# Patient Record
Sex: Male | Born: 2005 | Race: White | Hispanic: No | Marital: Single | Smoking: Never smoker
Health system: Southern US, Community
[De-identification: ages and names within clinical notes are randomized; demographics above are authoritative.]

## PROBLEM LIST (undated history)

## (undated) DIAGNOSIS — J309 Allergic rhinitis, unspecified: Secondary | ICD-10-CM

## (undated) HISTORY — PX: TONSILLECTOMY: SUR1361

## (undated) HISTORY — DX: Allergic rhinitis, unspecified: J30.9

---
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
## 2023-03-15 IMAGING — DX DG CHEST 2V
2 series · 2 of 2 positions shown · non-contrast
Comparison: Chest x-ray dated 03/30/2021

CLINICAL DATA: Cough for 5 weeks.  Productive cough.

EXAM:
CHEST - 2 VIEW

[chest pa]
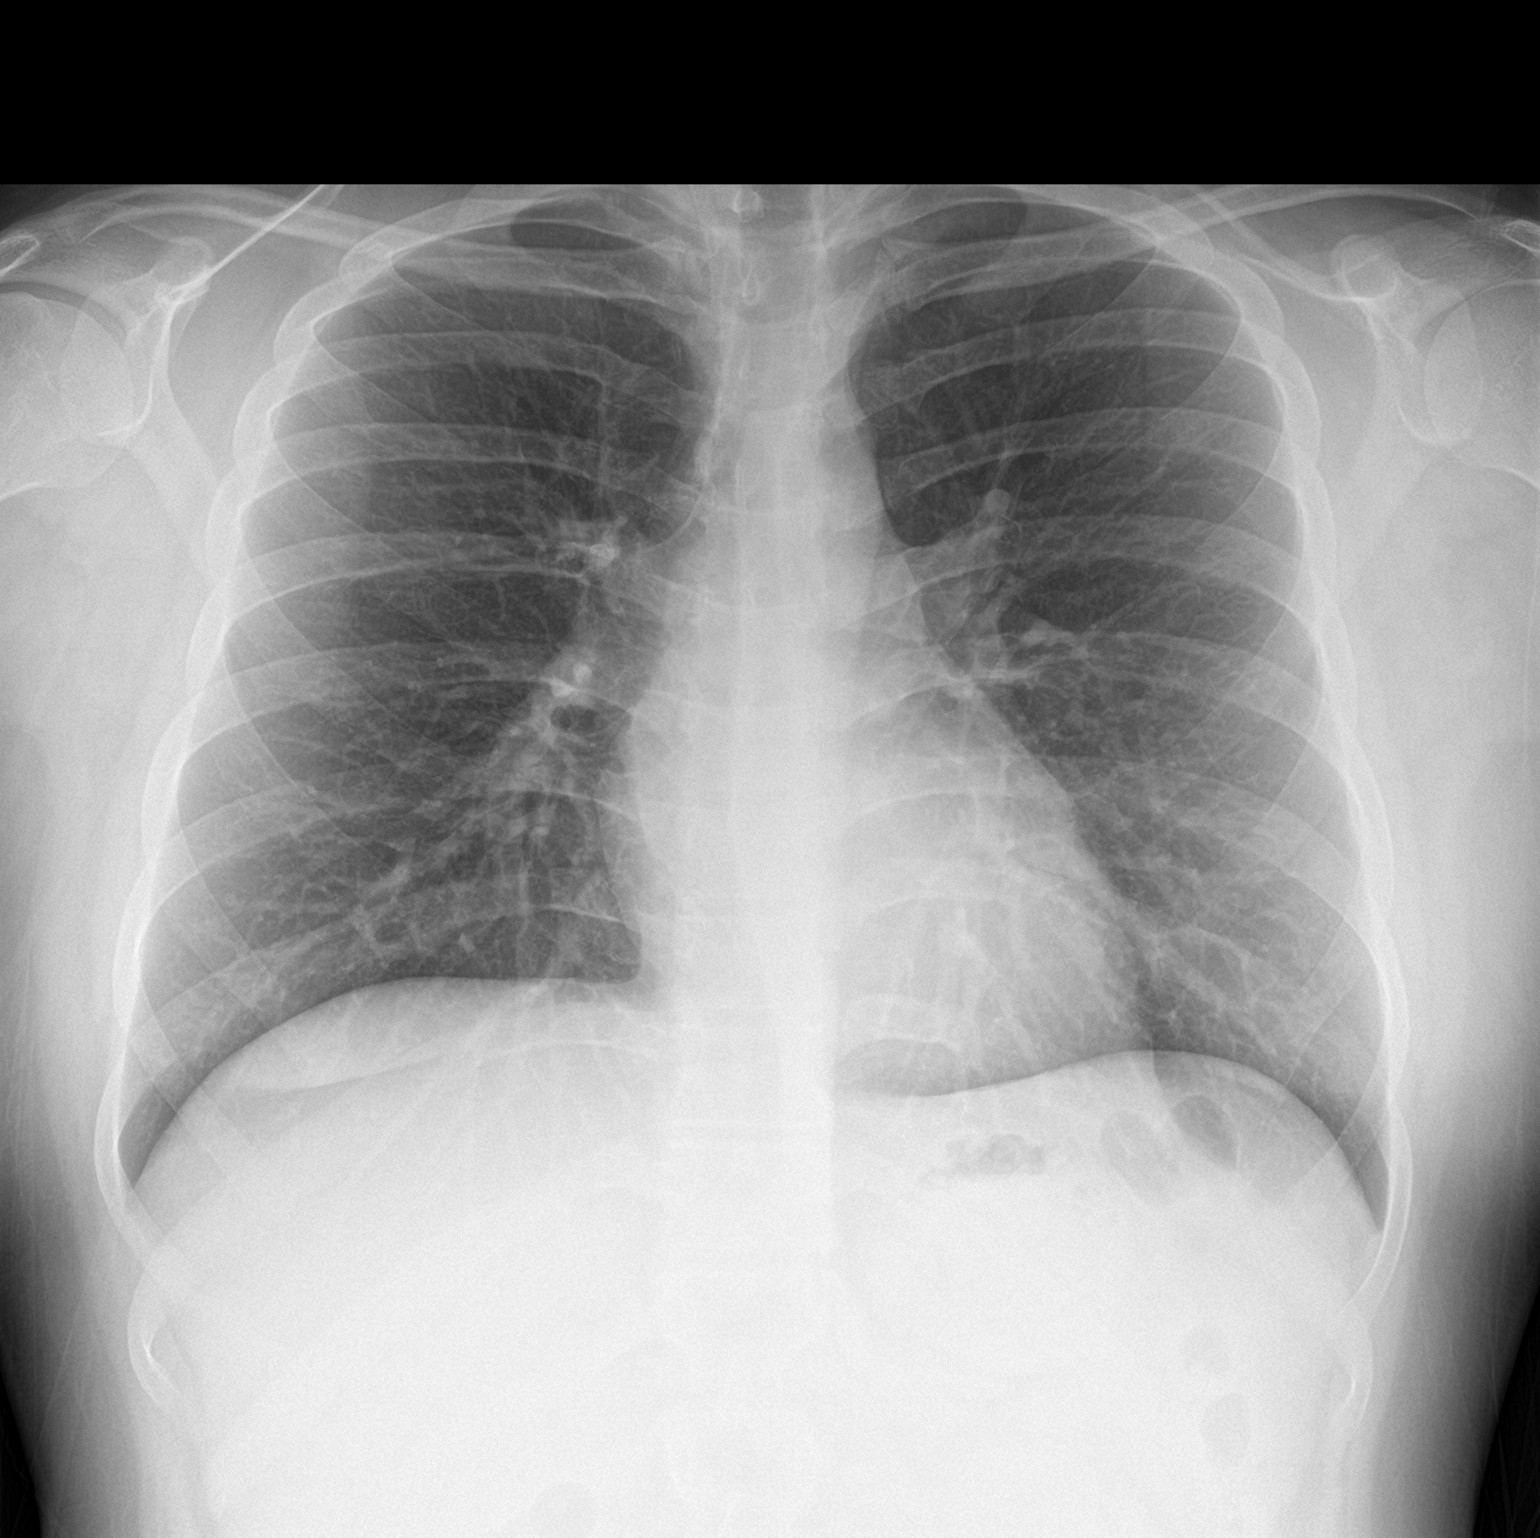

[chest lat]
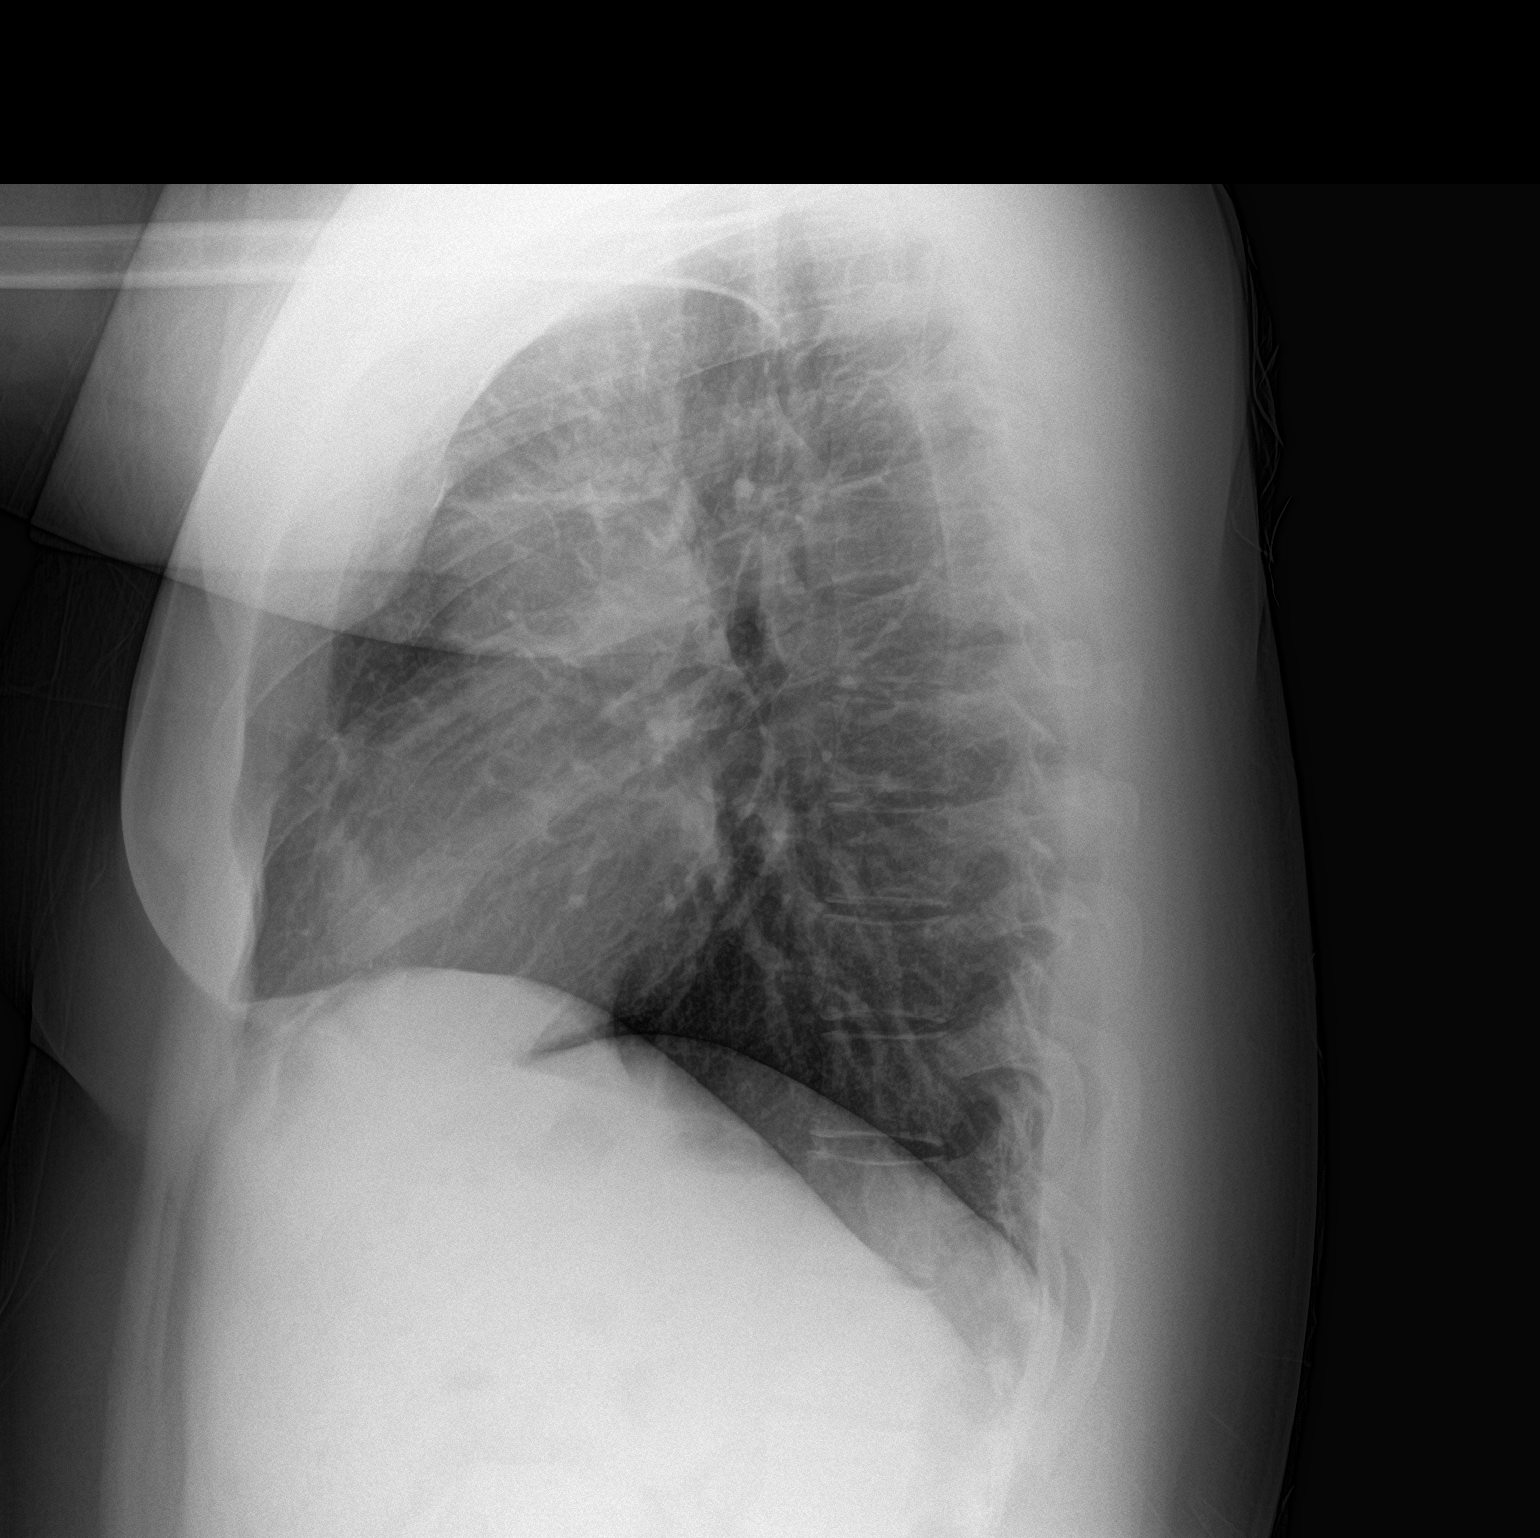

[2 of 2 positions shown; findings below may reference images not displayed]

FINDINGS: Cardiomediastinal silhouette is within normal limits in size and
configuration. Lungs are clear. Lung volumes are normal. No evidence
of pneumonia. No pleural effusion. No pneumothorax seen. Osseous
structures about the chest are unremarkable.
IMPRESSION: Normal chest x-ray.  No evidence of pneumonia.

## 2023-10-16 IMAGING — DX DG CHEST 2V
2 series · 2 of 2 positions shown · non-contrast
Comparison: Chest radiograph dated 11/10/2021.

CLINICAL DATA: Chest pain.

EXAM:
CHEST - 2 VIEW

[chest pa]
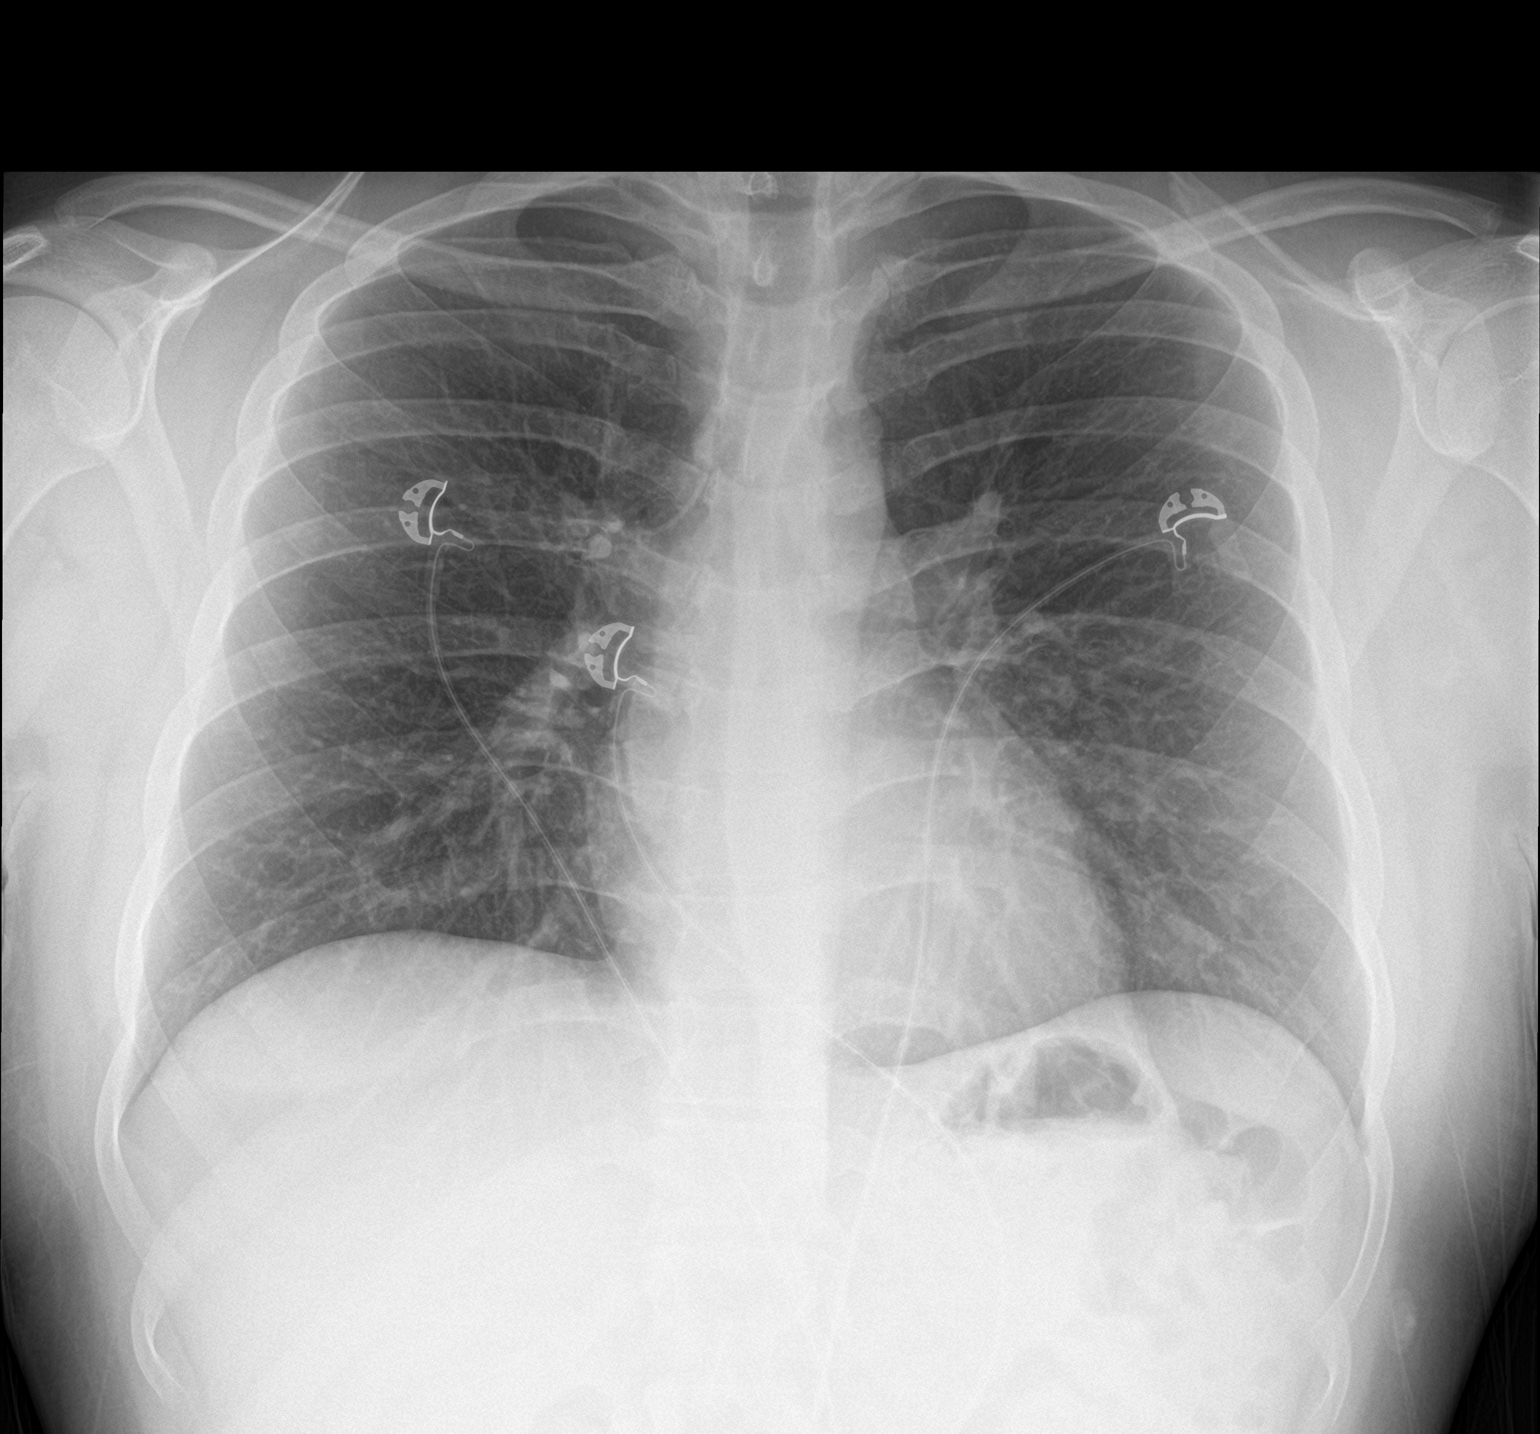

[chest lat]
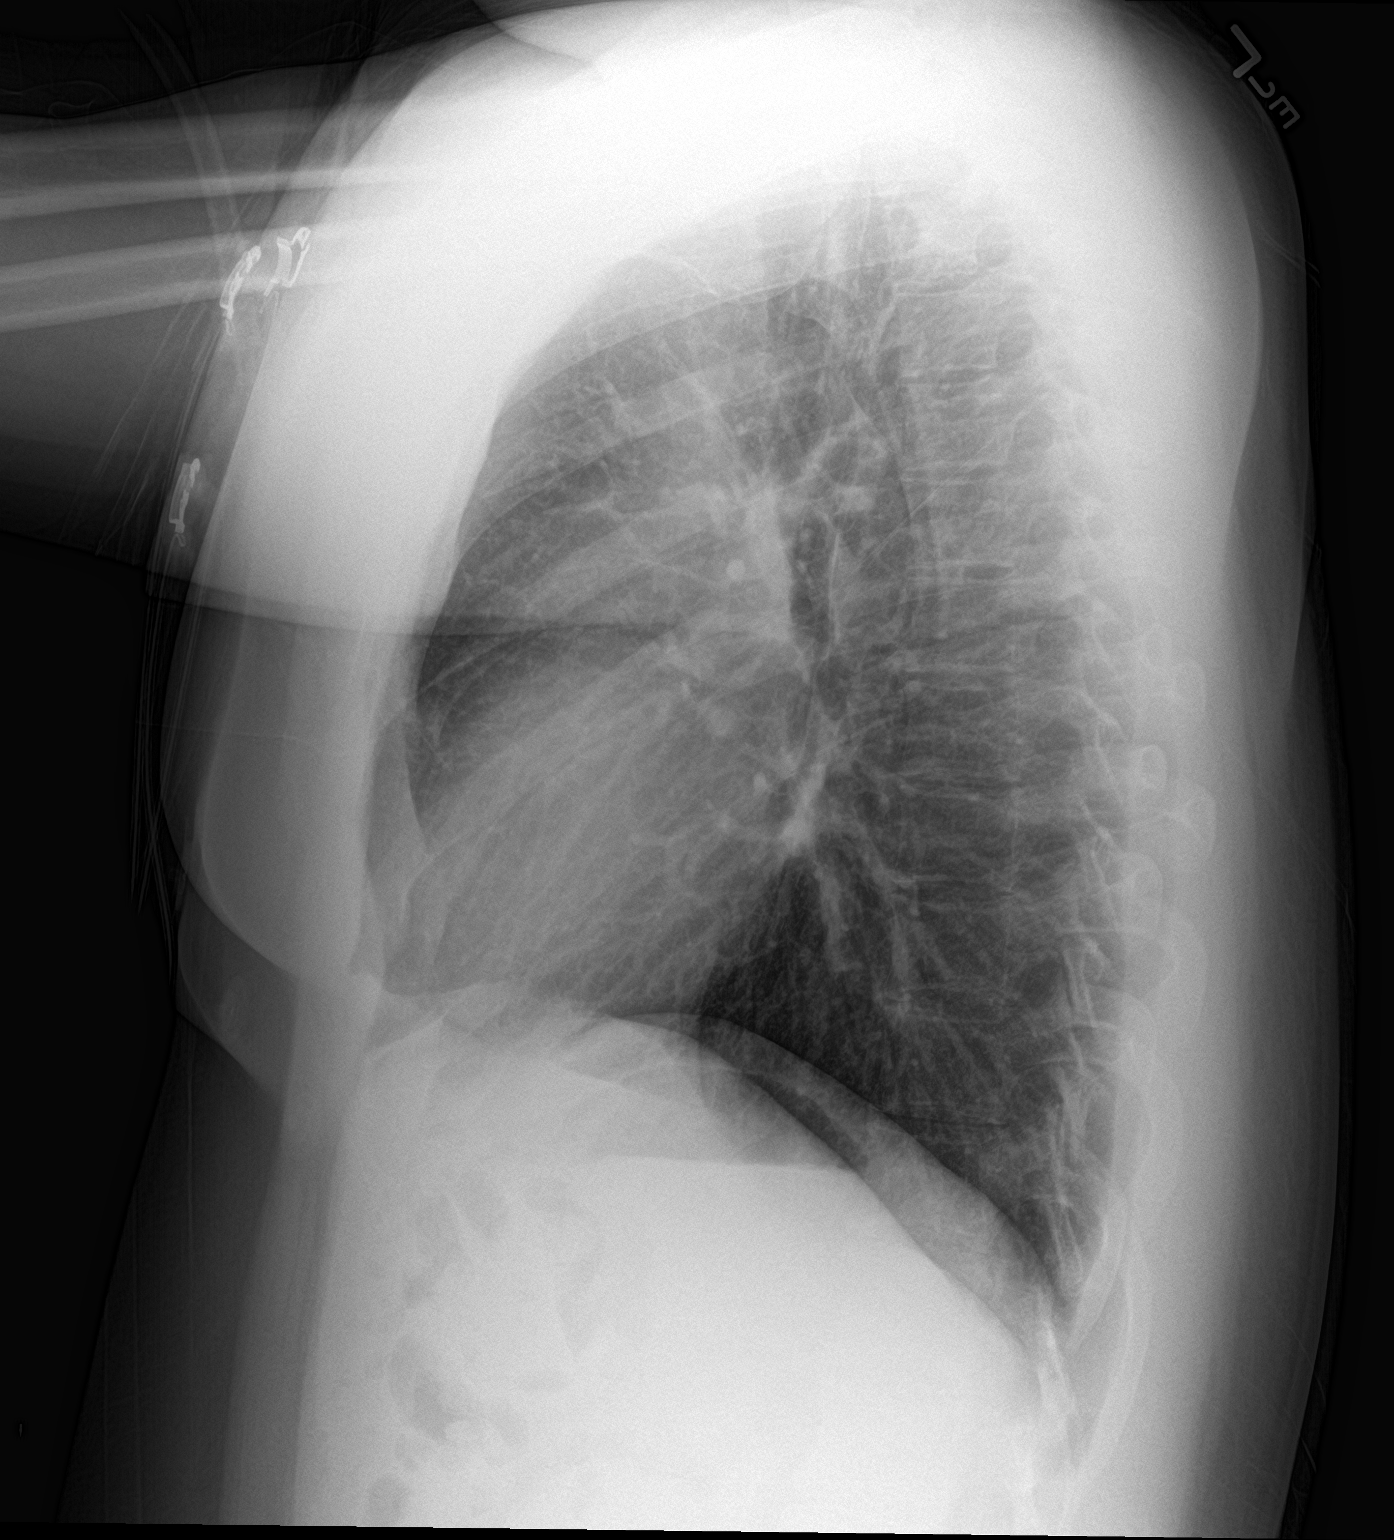

[2 of 2 positions shown; findings below may reference images not displayed]

FINDINGS: The heart size and mediastinal contours are within normal limits.
Both lungs are clear. The visualized skeletal structures are
unremarkable.
IMPRESSION: No active cardiopulmonary disease.

## 2023-11-28 ENCOUNTER — Other Ambulatory Visit: Payer: Self-pay

## 2023-11-28 ENCOUNTER — Encounter (HOSPITAL_BASED_OUTPATIENT_CLINIC_OR_DEPARTMENT_OTHER): Payer: Self-pay | Admitting: Emergency Medicine

## 2023-11-28 ENCOUNTER — Emergency Department (HOSPITAL_BASED_OUTPATIENT_CLINIC_OR_DEPARTMENT_OTHER)
Admission: EM | Admit: 2023-11-28 | Discharge: 2023-11-29 | Disposition: A | Discharge status: Home / Self Care | Attending: Emergency Medicine | Admitting: Emergency Medicine

## 2023-11-28 ENCOUNTER — Emergency Department (HOSPITAL_BASED_OUTPATIENT_CLINIC_OR_DEPARTMENT_OTHER)

## 2023-11-28 DIAGNOSIS — R1010 Upper abdominal pain, unspecified: Secondary | ICD-10-CM | POA: Insufficient documentation

## 2023-11-28 DIAGNOSIS — R000 Tachycardia, unspecified: Secondary | ICD-10-CM | POA: Diagnosis not present

## 2023-11-28 DIAGNOSIS — J45909 Unspecified asthma, uncomplicated: Secondary | ICD-10-CM | POA: Diagnosis not present

## 2023-11-28 DIAGNOSIS — R112 Nausea with vomiting, unspecified: Secondary | ICD-10-CM | POA: Insufficient documentation

## 2023-11-28 DIAGNOSIS — R6883 Chills (without fever): Secondary | ICD-10-CM | POA: Diagnosis not present

## 2023-11-28 LAB — HEPATIC FUNCTION PANEL
ALT: 25 U/L (ref 0–44)
AST: 27 U/L (ref 15–41)
Albumin: 4.5 g/dL (ref 3.5–5.0)
Alkaline Phosphatase: 92 U/L (ref 38–126)
Bilirubin, Direct: 0.1 mg/dL (ref 0.0–0.2)
Indirect Bilirubin: 0.6 mg/dL (ref 0.3–0.9)
Total Bilirubin: 0.7 mg/dL (ref <1.2)
Total Protein: 8.4 g/dL — ABNORMAL HIGH (ref 6.5–8.1)

## 2023-11-28 LAB — CBC
HCT: 42.9 % (ref 39.0–52.0)
Hemoglobin: 14 g/dL (ref 13.0–17.0)
MCH: 25.4 pg — ABNORMAL LOW (ref 26.0–34.0)
MCHC: 32.6 g/dL (ref 30.0–36.0)
MCV: 77.9 fL — ABNORMAL LOW (ref 80.0–100.0)
Platelets: 583 10*3/uL — ABNORMAL HIGH (ref 150–400)
RBC: 5.51 MIL/uL (ref 4.22–5.81)
RDW: 13.6 % (ref 11.5–15.5)
WBC: 24.7 10*3/uL — ABNORMAL HIGH (ref 4.0–10.5)
nRBC: 0 % (ref 0.0–0.2)

## 2023-11-28 LAB — URINALYSIS, MICROSCOPIC (REFLEX)

## 2023-11-28 LAB — I-STAT CHEM 8, ED
BUN: 16 mg/dL (ref 6–20)
Calcium, Ion: 1.05 mmol/L — ABNORMAL LOW (ref 1.15–1.40)
Chloride: 104 mmol/L (ref 98–111)
Creatinine, Ser: 0.7 mg/dL (ref 0.61–1.24)
Glucose, Bld: 173 mg/dL — ABNORMAL HIGH (ref 70–99)
HCT: 43 % (ref 39.0–52.0)
Hemoglobin: 14.6 g/dL (ref 13.0–17.0)
Potassium: 5.4 mmol/L — ABNORMAL HIGH (ref 3.5–5.1)
Sodium: 137 mmol/L (ref 135–145)
TCO2: 23 mmol/L (ref 22–32)

## 2023-11-28 LAB — URINALYSIS, ROUTINE W REFLEX MICROSCOPIC
Bilirubin Urine: NEGATIVE
Glucose, UA: NEGATIVE mg/dL
Hgb urine dipstick: NEGATIVE
Ketones, ur: NEGATIVE mg/dL
Leukocytes,Ua: NEGATIVE
Nitrite: NEGATIVE
Protein, ur: 30 mg/dL — AB
Specific Gravity, Urine: 1.025 (ref 1.005–1.030)
pH: 7 (ref 5.0–8.0)

## 2023-11-28 LAB — LIPASE, BLOOD: Lipase: 22 U/L (ref 11–51)

## 2023-11-28 MED ORDER — IOHEXOL 300 MG/ML  SOLN
125.0000 mL | Freq: Once | INTRAMUSCULAR | Status: AC | PRN
  Administered 2023-11-28: 125 mL via INTRAVENOUS

## 2023-11-28 MED ORDER — LACTATED RINGERS IV BOLUS
1000.0000 mL | Freq: Once | INTRAVENOUS | Status: AC
  Administered 2023-11-28: 1000 mL via INTRAVENOUS

## 2023-11-28 MED ORDER — ONDANSETRON HCL 4 MG/2ML IJ SOLN
4.0000 mg | Freq: Once | INTRAMUSCULAR | Status: AC
  Administered 2023-11-28: 4 mg via INTRAVENOUS
  Filled 2023-11-28: qty 2

## 2023-11-28 NOTE — ED Notes (Signed)
Pt back from CT

## 2023-11-28 NOTE — ED Notes (Signed)
Pt provided a urinal and encouraged to provide a sample.

## 2023-11-28 NOTE — ED Provider Notes (Signed)
  Provider Note MRN:  [id number]  Arrival date & time: 11/29/23    ED Course and Medical Decision Making  Assumed care from Dr. Rubin Payor at shift change.  Nausea vomiting abdominal pain leukocytosis most likely gastroenteritis but awaiting CT to exclude emergent process such as appendicitis.  12:40 AM update: Patient is resting comfortably on reassessment, wakes easily, symptoms better controlled, says he feels a lot better.  Workup is reassuring with a CT scan that is largely unremarkable.  Appropriate for discharge with symptomatic management.  Procedures  Final Clinical Impressions(s) / ED Diagnoses     ICD-10-CM   1. Nausea and vomiting, unspecified vomiting type  R11.2       ED Discharge Orders          Ordered    ondansetron (ZOFRAN-ODT) 4 MG disintegrating tablet  Every 8 hours PRN        11/29/23 0041              Discharge Instructions      You were evaluated in the Emergency Department and after careful evaluation, we did not find any emergent condition requiring admission or further testing in the hospital.  Your exam/testing today is overall reassuring.  Symptoms likely due to a stomach bug.  Use the Zofran medication as needed for nausea, can use Tylenol or Motrin for discomfort, plenty of fluids and rest.  Please return to the Emergency Department if you experience any worsening of your condition.   Thank you for allowing [place] to be a part of your care.      Elmer Sow. Pilar Plate, MD [facility] [email or web address]    Sabas Sous, MD 11/29/23 [phone]

## 2023-11-28 NOTE — ED Notes (Signed)
Notified EDP that Istat 8 was ran x 3. K+ noted to be elevated to 8.5. Redraw noted to be 5.4.

## 2023-11-28 NOTE — ED Notes (Signed)
Pt to CT

## 2023-11-28 NOTE — ED Triage Notes (Signed)
Emesis today x 6 times , he ate some left over food . Denies diarrhea , obviously lethargic .

## 2023-11-28 NOTE — ED Notes (Signed)
Pt tolerating the PO challenge with Gatorade.

## 2023-11-28 NOTE — ED Provider Notes (Signed)
[place] [facility] Provider Note   CSN: [id number] Arrival date & time: 11/28/23  1718     History  Chief Complaint  Patient presents with   Emesis    Mitchell Floyd is a 17 y.o. male.   Emesis Patient presents with nausea and vomiting.  Began around 4 hours ago.  Thinks that he ate some food that was the cause.  States it was old.  No diarrhea.  States he just feels bad.  Had some chills.  No flank.    Past Medical History:  Diagnosis Date   ADD (attention deficit disorder)    Allergic rhinitis    Asthma    Depression    Generalized anxiety disorder     Home Medications Prior to Admission medications   Medication Sig Start Date End Date Taking? Authorizing Provider  acetaminophen (TYLENOL) 160 MG/5ML elixir Take 480 mg by mouth every 4 (four) hours as needed. 480 mg = 15 ml    [provider]  albuterol (PROAIR HFA) 108 (90 Base) MCG/ACT inhaler USE 2 INHALATIONS EVERY 4 HOURS AS NEEDED FOR SHORTNESS OF BREATH OR WHEEZING 05/14/20   Ambs, Norvel Richards, FNP  albuterol (PROVENTIL) (2.5 MG/3ML) 0.083% nebulizer solution Take 3 mLs (2.5 mg total) by nebulization every 6 (six) hours as needed. For wheezing or shortness of breath 04/21/21   Ambs, Norvel Richards, FNP  Ascorbic Acid (VITAMIN C PO) Take 1 tablet by mouth daily.    [provider]  azelastine (ASTELIN) 0.1 % nasal spray Use 1 spray per nostril 1-2 times daily as needed 11/17/20   Bobbitt, Heywood Iles, MD  benzonatate (TESSALON) 100 MG capsule Take 1 capsule (100 mg total) by mouth 3 (three) times daily as needed for cough. 04/24/21   Hetty Blend, FNP  cetirizine (ZYRTEC) 10 MG tablet Take 10 mg by mouth daily.    [provider]  cholecalciferol (VITAMIN D3) 25 MCG (1000 UT) tablet Take 1,000 Units by mouth daily.    [provider]  EPINEPHrine 0.3 mg/0.3 mL IJ SOAJ injection Use as directed for severe allergic reactions 05/14/20   Ambs, Norvel Richards, FNP   ergocalciferol (VITAMIN D2) 1.25 MG (50000 UT) capsule  06/15/20   [provider]  fexofenadine (ALLEGRA) 180 MG tablet Take 180 mg by mouth daily.    [provider]  fluticasone (FLONASE) 50 MCG/ACT nasal spray Place 2 sprays into both nostrils 2 (two) times daily. Patient taking differently: Place 2 sprays into both nostrils 2 (two) times daily as needed. 11/15/17   Bobbitt, Heywood Iles, MD  fluticasone-salmeterol (ADVAIR HFA) [phone] MCG/ACT inhaler 2 puffs twice daily with spacer to prevent coughing or wheezing. 04/13/21   Hetty Blend, FNP  lansoprazole (PREVACID) 30 MG capsule TAKE 1 CAPSULE DAILY 03/03/20   [provider]  montelukast (SINGULAIR) 10 MG tablet TAKE 1 TABLET AT BEDTIME 05/11/21   Ambs, Norvel Richards, FNP  Multiple Vitamin (MULTIVITAMIN) tablet Take 1 tablet by mouth daily.    [provider]  NON FORMULARY 2 injections every week, then at 0.50 cc goes every 4 weeks Patient not taking: Reported on 06/01/2021    [provider]  Spacer/Aero-Holding Chambers (EASIVENT MASK SMALL) MISC Use as directed with inhaler. 04/22/16   [provider]  Tiotropium Bromide Monohydrate (SPIRIVA RESPIMAT) 1.25 MCG/ACT AERS 2 puffs once daily for coughing or wheezing 04/13/21   Hetty Blend, FNP  omeprazole (PRILOSEC OTC) 20 MG tablet Take  10 mg by mouth daily.    03/09/12  [provider]      Allergies    Patient has no known allergies.    Review of Systems   Review of Systems  Gastrointestinal:  Positive for vomiting.    Physical Exam Updated Vital Signs BP 125/64   Pulse 75   Temp 98 F (36.7 C) (Oral)   Resp 16   Wt 117.9 kg   SpO2 99%  Physical Exam Vitals and nursing note reviewed.  Cardiovascular:     Rate and Rhythm: Regular rhythm. Tachycardia present.  Abdominal:     Tenderness: There is abdominal tenderness.     Comments: Mild upper abdominal tenderness without rebound or guarding.  No hernia palpated.   Musculoskeletal:        General: No tenderness.     Cervical back: Neck supple.  Neurological:     Mental Status: He is alert.     ED Results / Procedures / Treatments   Labs (all labs ordered are listed, but only abnormal results are displayed) Labs Reviewed  CBC - Abnormal; Notable for the following components:      Result Value   WBC 24.7 (*)    MCV 77.9 (*)    MCH 25.4 (*)    Platelets 583 (*)    All other components within normal limits  URINALYSIS, ROUTINE W REFLEX MICROSCOPIC - Abnormal; Notable for the following components:   Protein, ur 30 (*)    All other components within normal limits  HEPATIC FUNCTION PANEL - Abnormal; Notable for the following components:   Total Protein 8.4 (*)    All other components within normal limits  URINALYSIS, MICROSCOPIC (REFLEX) - Abnormal; Notable for the following components:   Bacteria, UA FEW (*)    All other components within normal limits  I-STAT CHEM 8, ED - Abnormal; Notable for the following components:   Potassium 5.4 (*)    Glucose, Bld 173 (*)    Calcium, Ion 1.05 (*)    All other components within normal limits  LIPASE, BLOOD    EKG None  Radiology No results found.  Procedures Procedures    Medications Ordered in ED Medications  lactated ringers bolus 1,000 mL (0 mLs Intravenous Stopped 11/28/23 1851)  ondansetron (ZOFRAN) injection 4 mg (4 mg Intravenous Given 11/28/23 1753)  lactated ringers bolus 1,000 mL (0 mLs Intravenous Stopped 11/28/23 2058)  iohexol (OMNIPAQUE) 300 MG/ML solution 125 mL (125 mLs Intravenous Contrast Given 11/28/23 2145)    ED Course/ Medical Decision Making/ A&P                                 Medical Decision Making Amount and/or Complexity of Data Reviewed Labs: ordered. Radiology: ordered.  Risk Prescription drug management.   Patient with nausea and vomiting.  Some abdominal cramping.  Differential diagnosis does include food poisoning, gastroenteritis, obstruction.   Obstruction felt less likely.  Not surgeries.  Will get basic blood work.  Will give antiemetics in the fluid bolus.  White count is limited.  Blood work otherwise reassuring.  Potassium of 5.4.  Now sleeping comfortably.  Actually sleeping somewhat heavily.  Palpation of the abdomen does not wake him up.  Will give oral trial.  Still nauseous.  With elevated white count will get CT scan to further evaluate.  Patient is somewhat slow to answer but reportedly gets like this when he gets sick  Care turned [facility] to [facility]         Final Clinical Impression(s) / ED Diagnoses Final diagnoses:  Nausea and vomiting, unspecified vomiting type    Rx / DC Orders ED Discharge Orders     None         Benjiman Core, MD 11/28/23 2244

## 2023-11-28 NOTE — ED Notes (Signed)
Pt is drinking Gatorade at this time

## 2023-11-29 MED ORDER — ONDANSETRON 4 MG PO TBDP: 4.0000 mg | ORAL_TABLET | Freq: Three times a day (TID) | ORAL | 0 refills | Status: AC | PRN

## 2023-11-29 NOTE — Discharge Instructions (Signed)
You were evaluated in the Emergency Department and after careful evaluation, we did not find any emergent condition requiring admission or further testing in the hospital.  Your exam/testing today is overall reassuring.  Symptoms likely due to a stomach bug.  Use the Zofran medication as needed for nausea, can use Tylenol or Motrin for discomfort, plenty of fluids and rest.  Please return to the Emergency Department if you experience any worsening of your condition.   Thank you for allowing [place] to be a part of your care.

## 2023-11-29 NOTE — ED Notes (Signed)
Reviewed D/C information with the patient, pt verbalized understanding. No additional concerns at this time.
